# Patient Record
Sex: Male | Born: 2000
Health system: Southern US, Community
[De-identification: ages and names within clinical notes are randomized; demographics above are authoritative.]

## PROBLEM LIST (undated history)

## (undated) DIAGNOSIS — R569 Unspecified convulsions: Secondary | ICD-10-CM

## (undated) HISTORY — PX: INGUINAL HERNIA REPAIR: SUR1180

## (undated) HISTORY — PX: NO PAST SURGERIES: SHX2092

## (undated) HISTORY — PX: OTHER SURGICAL HISTORY: SHX169

## (undated) HISTORY — PX: DENTAL TRAUMA REPAIR (TOOTH REIMPLANTATION): SHX1450

---
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
## 2021-02-26 ENCOUNTER — Other Ambulatory Visit (HOSPITAL_COMMUNITY): Payer: Self-pay

## 2021-05-20 ENCOUNTER — Other Ambulatory Visit (HOSPITAL_COMMUNITY): Payer: Self-pay

## 2021-08-29 ENCOUNTER — Other Ambulatory Visit (HOSPITAL_COMMUNITY): Payer: Self-pay

## 2021-09-03 ENCOUNTER — Other Ambulatory Visit (HOSPITAL_COMMUNITY): Payer: Self-pay

## 2021-11-13 ENCOUNTER — Other Ambulatory Visit (HOSPITAL_COMMUNITY): Payer: Self-pay

## 2021-11-13 MED ORDER — LAMOTRIGINE ER 25 MG PO TB24
ORAL_TABLET | ORAL | 0 refills | Status: AC
  Filled 2021-11-13: qty 30, 15d supply, fill #0
  Filled 2021-11-15: qty 61, 41d supply, fill #0

## 2021-11-14 ENCOUNTER — Other Ambulatory Visit (HOSPITAL_COMMUNITY): Payer: Self-pay

## 2021-11-15 ENCOUNTER — Other Ambulatory Visit (HOSPITAL_COMMUNITY): Payer: Self-pay

## 2021-11-19 ENCOUNTER — Other Ambulatory Visit (HOSPITAL_COMMUNITY): Payer: Self-pay

## 2021-11-19 MED ORDER — LAMOTRIGINE 25 MG PO TABS
ORAL_TABLET | ORAL | 0 refills | Status: DC
  Filled 2021-11-19: qty 49, 42d supply, fill #0

## 2021-11-20 ENCOUNTER — Other Ambulatory Visit (HOSPITAL_COMMUNITY): Payer: Self-pay

## 2021-11-20 MED ORDER — DIVALPROEX SODIUM ER 500 MG PO TB24
ORAL_TABLET | ORAL | 3 refills | Status: AC
  Filled 2021-11-20: qty 90, 30d supply, fill #0
  Filled 2022-01-08: qty 90, 30d supply, fill #1

## 2021-12-30 ENCOUNTER — Other Ambulatory Visit (HOSPITAL_COMMUNITY): Payer: Self-pay

## 2021-12-30 MED ORDER — LAMOTRIGINE 25 MG PO TABS
50.0000 mg | ORAL_TABLET | Freq: Two times a day (BID) | ORAL | 5 refills | Status: AC
  Filled 2021-12-30 – 2022-12-13 (×2): qty 60, 15d supply, fill #0

## 2022-01-08 ENCOUNTER — Other Ambulatory Visit (HOSPITAL_COMMUNITY): Payer: Self-pay

## 2022-01-25 ENCOUNTER — Other Ambulatory Visit (HOSPITAL_COMMUNITY): Payer: Self-pay

## 2022-02-03 ENCOUNTER — Other Ambulatory Visit (HOSPITAL_COMMUNITY): Payer: Self-pay

## 2022-02-07 ENCOUNTER — Other Ambulatory Visit (HOSPITAL_COMMUNITY): Payer: Self-pay

## 2022-02-07 MED ORDER — LAMOTRIGINE 100 MG PO TABS
100.0000 mg | ORAL_TABLET | Freq: Two times a day (BID) | ORAL | 3 refills | Status: DC
  Filled 2022-02-07: qty 180, 90d supply, fill #0
  Filled 2022-04-29: qty 180, 90d supply, fill #1
  Filled 2022-06-13: qty 180, 90d supply, fill #2
  Filled 2022-09-17: qty 180, 90d supply, fill #3

## 2022-02-25 ENCOUNTER — Other Ambulatory Visit (HOSPITAL_COMMUNITY): Payer: Self-pay

## 2022-04-29 ENCOUNTER — Other Ambulatory Visit (HOSPITAL_COMMUNITY): Payer: Self-pay

## 2022-06-13 ENCOUNTER — Other Ambulatory Visit (HOSPITAL_COMMUNITY): Payer: Self-pay

## 2022-09-17 ENCOUNTER — Other Ambulatory Visit (HOSPITAL_COMMUNITY): Payer: Self-pay

## 2022-09-17 ENCOUNTER — Other Ambulatory Visit: Payer: Self-pay

## 2022-12-13 ENCOUNTER — Other Ambulatory Visit (HOSPITAL_COMMUNITY): Payer: Self-pay

## 2022-12-16 ENCOUNTER — Other Ambulatory Visit (HOSPITAL_COMMUNITY): Payer: Self-pay

## 2022-12-16 MED ORDER — LAMOTRIGINE 100 MG PO TABS
ORAL_TABLET | ORAL | 0 refills | Status: DC
  Filled 2022-12-16: qty 180, 90d supply, fill #0

## 2022-12-25 ENCOUNTER — Other Ambulatory Visit (HOSPITAL_COMMUNITY): Payer: Self-pay

## 2022-12-26 ENCOUNTER — Other Ambulatory Visit (HOSPITAL_COMMUNITY): Payer: Self-pay

## 2023-03-25 ENCOUNTER — Other Ambulatory Visit (HOSPITAL_COMMUNITY): Payer: Self-pay

## 2023-03-25 MED ORDER — LAMOTRIGINE 100 MG PO TABS
100.0000 mg | ORAL_TABLET | Freq: Two times a day (BID) | ORAL | 3 refills | Status: AC
  Filled 2023-03-25 (×3): qty 180, 90d supply, fill #0

## 2023-04-04 ENCOUNTER — Other Ambulatory Visit (HOSPITAL_COMMUNITY): Payer: Self-pay
